# Patient Record
Sex: Male | Born: 1960 | Hispanic: No | Marital: Married | Smoking: Current some day smoker
Health system: Southern US, Community
[De-identification: ages and names within clinical notes are randomized; demographics above are authoritative.]

## PROBLEM LIST (undated history)

## (undated) HISTORY — PX: SHOULDER SURGERY: SHX246

---

## 2014-08-17 ENCOUNTER — Encounter (HOSPITAL_COMMUNITY): Payer: Self-pay | Admitting: Emergency Medicine

## 2014-08-17 ENCOUNTER — Emergency Department (HOSPITAL_COMMUNITY)
Admission: EM | Admit: 2014-08-17 | Discharge: 2014-08-17 | Disposition: A | Discharge status: Home / Self Care | Payer: No Typology Code available for payment source | Attending: Emergency Medicine | Admitting: Emergency Medicine

## 2014-08-17 ENCOUNTER — Emergency Department (HOSPITAL_COMMUNITY): Payer: No Typology Code available for payment source

## 2014-08-17 DIAGNOSIS — R197 Diarrhea, unspecified: Secondary | ICD-10-CM | POA: Insufficient documentation

## 2014-08-17 DIAGNOSIS — Y998 Other external cause status: Secondary | ICD-10-CM | POA: Insufficient documentation

## 2014-08-17 DIAGNOSIS — S46911A Strain of unspecified muscle, fascia and tendon at shoulder and upper arm level, right arm, initial encounter: Secondary | ICD-10-CM | POA: Insufficient documentation

## 2014-08-17 DIAGNOSIS — Y92410 Unspecified street and highway as the place of occurrence of the external cause: Secondary | ICD-10-CM | POA: Insufficient documentation

## 2014-08-17 DIAGNOSIS — Y9389 Activity, other specified: Secondary | ICD-10-CM | POA: Diagnosis not present

## 2014-08-17 DIAGNOSIS — Z720 Tobacco use: Secondary | ICD-10-CM | POA: Diagnosis not present

## 2014-08-17 DIAGNOSIS — Z7982 Long term (current) use of aspirin: Secondary | ICD-10-CM | POA: Insufficient documentation

## 2014-08-17 DIAGNOSIS — S161XXA Strain of muscle, fascia and tendon at neck level, initial encounter: Secondary | ICD-10-CM

## 2014-08-17 DIAGNOSIS — S0990XA Unspecified injury of head, initial encounter: Secondary | ICD-10-CM | POA: Insufficient documentation

## 2014-08-17 LAB — CBC WITH DIFFERENTIAL/PLATELET
BASOS ABS: 0 10*3/uL (ref 0.0–0.1)
Basophils Relative: 1 % (ref 0–1)
Eosinophils Absolute: 0.1 10*3/uL (ref 0.0–0.7)
Eosinophils Relative: 2 % (ref 0–5)
HCT: 45.1 % (ref 39.0–52.0)
Hemoglobin: 15.3 g/dL (ref 13.0–17.0)
Lymphocytes Relative: 33 % (ref 12–46)
Lymphs Abs: 2 10*3/uL (ref 0.7–4.0)
MCH: 31.2 pg (ref 26.0–34.0)
MCHC: 33.9 g/dL (ref 30.0–36.0)
MCV: 92 fL (ref 78.0–100.0)
Monocytes Absolute: 0.3 10*3/uL (ref 0.1–1.0)
Monocytes Relative: 5 % (ref 3–12)
NEUTROS ABS: 3.7 10*3/uL (ref 1.7–7.7)
NEUTROS PCT: 59 % (ref 43–77)
PLATELETS: 249 10*3/uL (ref 150–400)
RBC: 4.9 MIL/uL (ref 4.22–5.81)
RDW: 12.4 % (ref 11.5–15.5)
WBC: 6.1 10*3/uL (ref 4.0–10.5)

## 2014-08-17 LAB — BASIC METABOLIC PANEL
Anion gap: 9 (ref 5–15)
BUN: 13 mg/dL (ref 6–23)
CO2: 25 mmol/L (ref 19–32)
Calcium: 8.5 mg/dL (ref 8.4–10.5)
Chloride: 104 mmol/L (ref 96–112)
Creatinine, Ser: 0.79 mg/dL (ref 0.50–1.35)
GFR calc Af Amer: >90 mL/min (ref >90)
GFR calc non Af Amer: >90 mL/min (ref >90)
Glucose, Bld: 101 mg/dL — ABNORMAL HIGH (ref 70–99)
POTASSIUM: 4.2 mmol/L (ref 3.5–5.1)
SODIUM: 138 mmol/L (ref 135–145)

## 2014-08-17 MED ORDER — NAPROXEN 500 MG PO TABS: 500.0000 mg | ORAL_TABLET | Freq: Two times a day (BID) | ORAL | Status: AC

## 2014-08-17 MED ORDER — HYDROCODONE-ACETAMINOPHEN 5-325 MG PO TABS: 1.0000 | ORAL_TABLET | Freq: Four times a day (QID) | ORAL | Status: AC | PRN

## 2014-08-17 NOTE — ED Notes (Signed)
Patient transported to X-ray 
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
## 2014-08-17 NOTE — ED Notes (Signed)
Patient returned from CT
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
## 2014-08-17 NOTE — ED Notes (Signed)
Dr. Deretha EmoryZackowski at bedside using translator services with patient.

## 2014-08-17 NOTE — Discharge Instructions (Signed)
Follow-up with orthopedics for the right shoulder discomfort if it does not resolve in the next few days. Wear the sling as needed for comfort. Take the Naprosyn on a regular basis. Supplement with the hydrocodone as needed for additional pain relief. Work note provided. Return for any new or worse symptoms.
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
